# Patient Record
Sex: Male | Born: 1995
Health system: Southern US, Community
[De-identification: ages and names within clinical notes are randomized; demographics above are authoritative.]

## PROBLEM LIST (undated history)

## (undated) DIAGNOSIS — J45909 Unspecified asthma, uncomplicated: Secondary | ICD-10-CM

## (undated) HISTORY — PX: TYMPANOSTOMY TUBE PLACEMENT: SHX32

---
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
## 2016-06-28 ENCOUNTER — Encounter (HOSPITAL_COMMUNITY): Payer: Self-pay | Admitting: *Deleted

## 2016-06-28 ENCOUNTER — Emergency Department (HOSPITAL_COMMUNITY)
Admission: EM | Admit: 2016-06-28 | Discharge: 2016-06-28 | Disposition: A | Discharge status: Home / Self Care | Payer: Commercial Managed Care - PPO | Attending: Emergency Medicine | Admitting: Emergency Medicine

## 2016-06-28 DIAGNOSIS — E1165 Type 2 diabetes mellitus with hyperglycemia: Secondary | ICD-10-CM | POA: Diagnosis not present

## 2016-06-28 DIAGNOSIS — Z79899 Other long term (current) drug therapy: Secondary | ICD-10-CM | POA: Diagnosis not present

## 2016-06-28 DIAGNOSIS — K529 Noninfective gastroenteritis and colitis, unspecified: Secondary | ICD-10-CM | POA: Diagnosis not present

## 2016-06-28 DIAGNOSIS — E860 Dehydration: Secondary | ICD-10-CM | POA: Insufficient documentation

## 2016-06-28 DIAGNOSIS — J45909 Unspecified asthma, uncomplicated: Secondary | ICD-10-CM | POA: Insufficient documentation

## 2016-06-28 DIAGNOSIS — R739 Hyperglycemia, unspecified: Secondary | ICD-10-CM

## 2016-06-28 DIAGNOSIS — Z794 Long term (current) use of insulin: Secondary | ICD-10-CM | POA: Insufficient documentation

## 2016-06-28 LAB — BASIC METABOLIC PANEL
Anion gap: 13 (ref 5–15)
BUN: 23 mg/dL — AB (ref 6–20)
CALCIUM: 9.3 mg/dL (ref 8.9–10.3)
CO2: 21 mmol/L — AB (ref 22–32)
CREATININE: 1.18 mg/dL (ref 0.61–1.24)
Chloride: 94 mmol/L — ABNORMAL LOW (ref 101–111)
GFR calc Af Amer: >60 mL/min (ref >60)
GFR calc non Af Amer: >60 mL/min (ref >60)
GLUCOSE: 410 mg/dL — AB (ref 65–99)
Potassium: 4 mmol/L (ref 3.5–5.1)
Sodium: 128 mmol/L — ABNORMAL LOW (ref 135–145)

## 2016-06-28 LAB — CBC
HCT: 47.2 % (ref 39.0–52.0)
Hemoglobin: 16.6 g/dL (ref 13.0–17.0)
MCH: 29.2 pg (ref 26.0–34.0)
MCHC: 35.2 g/dL (ref 30.0–36.0)
MCV: 83.1 fL (ref 78.0–100.0)
PLATELETS: 312 10*3/uL (ref 150–400)
RBC: 5.68 MIL/uL (ref 4.22–5.81)
RDW: 11.8 % (ref 11.5–15.5)
WBC: 13 10*3/uL — ABNORMAL HIGH (ref 4.0–10.5)

## 2016-06-28 LAB — URINALYSIS, ROUTINE W REFLEX MICROSCOPIC
Bacteria, UA: NONE SEEN
Bilirubin Urine: NEGATIVE
HGB URINE DIPSTICK: NEGATIVE
Ketones, ur: 20 mg/dL — AB
Leukocytes, UA: NEGATIVE
Nitrite: NEGATIVE
Protein, ur: NEGATIVE mg/dL
SPECIFIC GRAVITY, URINE: 1.028 (ref 1.005–1.030)
Squamous Epithelial / LPF: NONE SEEN
pH: 5 (ref 5.0–8.0)

## 2016-06-28 LAB — CBG MONITORING, ED
Glucose-Capillary: 377 mg/dL — ABNORMAL HIGH (ref 65–99)
Glucose-Capillary: 307 mg/dL — ABNORMAL HIGH (ref 65–99)

## 2016-06-28 MED ORDER — INSULIN ASPART 100 UNIT/ML ~~LOC~~ SOLN
10.0000 [IU] | Freq: Once | SUBCUTANEOUS | Status: AC
  Administered 2016-06-28: 10 [IU] via INTRAVENOUS
  Filled 2016-06-28: qty 1

## 2016-06-28 MED ORDER — SODIUM CHLORIDE 0.9 % IV BOLUS (SEPSIS)
2000.0000 mL | Freq: Once | INTRAVENOUS | Status: AC
  Administered 2016-06-28: 2000 mL via INTRAVENOUS

## 2016-06-28 MED ORDER — ONDANSETRON 4 MG PO TBDP: 4.0000 mg | ORAL_TABLET | Freq: Three times a day (TID) | ORAL | 0 refills | Status: AC | PRN

## 2016-06-28 MED ORDER — ONDANSETRON HCL 4 MG/2ML IJ SOLN
4.0000 mg | Freq: Once | INTRAMUSCULAR | Status: AC
  Administered 2016-06-28: 4 mg via INTRAVENOUS
  Filled 2016-06-28: qty 2

## 2016-06-28 NOTE — ED Provider Notes (Signed)
[facility]-EMERGENCY DEPT Provider Note   CSN: [id number] Arrival date & time: 06/28/16  1645     History   Chief Complaint Chief Complaint  Patient presents with  . Hyperglycemia    HPI Ronald Harrison is a 21 y.o. male.  Patient is a 21 year old male with a history of insulin-dependent diabetes presenting today from clinic for nausea, vomiting, diarrhea and elevated ketones in his urine. Patient states yesterday he woke up with son onset of nausea vomiting and diarrhea. He vomited over 20 times. Today he is felt weak and dizzy mostly with standing and walking.  He has not vomited today but has not had anything to eat or drink. He is only used 8 units of insulin. He denies any abdominal pain, dysuria, frequency or urgency. He denies any cough, shortness of breath, or sore throat.   The history is provided by the patient.    Past Medical History:  Diagnosis Date  . Asthma   . Diabetes mellitus     There are no active problems to display for this patient.   Past Surgical History:  Procedure Laterality Date  . TYMPANOSTOMY TUBE PLACEMENT         Home Medications    Prior to Admission medications   Medication Sig Start Date End Date Taking? Authorizing Provider  BD PEN NEEDLE NANO U/F 32G X 4 MM MISC USE AS DIRECTED TO DOSE INSULIN UP TO 8 TIMES PER DAY 08/03/15   Historical Provider, MD  insulin aspart (NOVOLOG) 100 UNIT/ML FlexPen Inject into the skin 3 (three) times daily with meals.    Historical Provider, MD  Insulin Degludec (TRESIBA FLEXTOUCH) 100 UNIT/ML SOPN Inject 50 Units into the skin daily. 10/02/15   Reather Littler, MD  insulin glargine (LANTUS) 100 UNIT/ML injection Inject into the skin at bedtime.    Historical Provider, MD  Insulin Glargine (TOUJEO SOLOSTAR) 300 UNIT/ML SOPN Inject 50 Units into the skin daily. 10/05/15   Reather Littler, MD  lisinopril (PRINIVIL,ZESTRIL) 30 MG tablet TAKE 1 TABLET ONCE A DAY ORALLY 30 DAY(S) 09/25/15   Historical Provider, MD  pravastatin  (PRAVACHOL) 10 MG tablet TAKE 1 TABLET ONCE A DAY ORALLY *INS ALLOWS 30 DAYS 09/25/15   Historical Provider, MD    Family History Family History  Problem Relation Age of Onset  . Diabetes Father   . Diabetes Maternal Uncle   . Hypothyroidism Maternal Grandmother     Social History Social History  Substance Use Topics  . Smoking status: Never Smoker  . Smokeless tobacco: Never Used  . Alcohol use No     Allergies   Patient has no known allergies.   Review of Systems Review of Systems  All other systems reviewed and are negative.    Physical Exam Updated Vital Signs BP 120/76 (BP Location: Left Arm)   Pulse 119   Temp 98.9 F (37.2 C) (Oral)   Resp 17   Ht 5\' 7"  (1.702 m)   Wt 217 lb (98.4 kg)   SpO2 99%   BMI 33.99 kg/m   Physical Exam  Constitutional: He is oriented to person, place, and time. He appears well-developed and well-nourished. No distress.  HENT:  Head: Normocephalic and atraumatic.  Mouth/Throat: Oropharynx is clear and moist. Mucous membranes are dry.  Eyes: Conjunctivae and EOM are normal. Pupils are equal, round, and reactive to light.  Neck: Normal range of motion. Neck supple.  Cardiovascular: Regular rhythm and intact distal pulses.  Tachycardia present.   No murmur  heard. Pulmonary/Chest: Effort normal and breath sounds normal. No respiratory distress. He has no wheezes. He has no rales.  Abdominal: Soft. He exhibits no distension. There is no tenderness. There is no rebound and no guarding.  Musculoskeletal: Normal range of motion. He exhibits no edema or tenderness.  Neurological: He is alert and oriented to person, place, and time.  Skin: Skin is warm and dry. No rash noted. No erythema.  Psychiatric: He has a normal mood and affect. His behavior is normal.  Nursing note and vitals reviewed.    ED Treatments / Results  Labs (all labs ordered are listed, but only abnormal results are displayed) Labs Reviewed  BASIC METABOLIC PANEL  - Abnormal; Notable for the following:       Result Value   Sodium 128 (*)    Chloride 94 (*)    CO2 21 (*)    Glucose, Bld 410 (*)    BUN 23 (*)    All other components within normal limits  CBC - Abnormal; Notable for the following:    WBC 13.0 (*)    All other components within normal limits  URINALYSIS, ROUTINE W REFLEX MICROSCOPIC - Abnormal; Notable for the following:    Glucose, UA >=500 (*)    Ketones, ur 20 (*)    All other components within normal limits  CBG MONITORING, ED - Abnormal; Notable for the following:    Glucose-Capillary 377 (*)    All other components within normal limits  CBG MONITORING, ED - Abnormal; Notable for the following:    Glucose-Capillary 307 (*)    All other components within normal limits    EKG  EKG Interpretation None       Radiology No results found.  Procedures Procedures (including critical care time)  Medications Ordered in ED Medications  sodium chloride 0.9 % bolus 2,000 mL (not administered)  ondansetron (ZOFRAN) injection 4 mg (not administered)     Initial Impression / Assessment and Plan / ED Course  I have reviewed the triage vital signs and the nursing notes.  Pertinent labs & imaging results that were available during my care of the patient were reviewed by me and considered in my medical decision making (see chart for details).    Pt with symptoms most consistent with a viral process with fever/vomitting/diarrhea starting yesterday and improved today but feeling weak and dizzy with standing and walking.  Pt has not used much insulin today because he has not eaten.  He was seen at the clinic today and stated his ketones were high and sent here.  Denies bad food exposure and recent travel out of the country.  No recent abx.  No hx concerning for GU pathology or kidney stones.  Pt is awake and alert on exam without peritoneal signs. Labs without signs of DKA today.  Anion gap wnl.  Hyperglcyemia and  pseudohyponatremia, mild leukocytosis but o/w wnl. Pt given IVF and zofran  9:16 PM UA with only 20 ketones.  Pt on re-eval is hungry and eating.  Repeat BS 307.  Pt to start sliding scale at home.  Will d/c home.  Given 10u of insulin prior to discharge.   Final Clinical Impressions(s) / ED Diagnoses   Final diagnoses:  Hyperglycemia  Dehydration  Gastroenteritis, acute    New Prescriptions New Prescriptions   ONDANSETRON (ZOFRAN ODT) 4 MG DISINTEGRATING TABLET    Take 1 tablet (4 mg total) by mouth every 8 (eight) hours as needed for nausea or vomiting.  Gwyneth Sprout, MD 06/28/16 2122

## 2016-06-28 NOTE — ED Triage Notes (Signed)
The pt is a  Insulin dependent  Diabetic that was sent here from his doctors office with abd pain nv and diarrhea since yesterday  He was checked for ketones and that registered high.  I cannot smell ketones on his breath

## 2016-12-09 DIAGNOSIS — E109 Type 1 diabetes mellitus without complications: Secondary | ICD-10-CM | POA: Diagnosis not present

## 2016-12-09 DIAGNOSIS — E1065 Type 1 diabetes mellitus with hyperglycemia: Secondary | ICD-10-CM | POA: Diagnosis not present

## 2016-12-09 DIAGNOSIS — Z974 Presence of external hearing-aid: Secondary | ICD-10-CM | POA: Diagnosis not present

## 2016-12-16 DIAGNOSIS — E785 Hyperlipidemia, unspecified: Secondary | ICD-10-CM | POA: Diagnosis not present

## 2016-12-16 DIAGNOSIS — E109 Type 1 diabetes mellitus without complications: Secondary | ICD-10-CM | POA: Diagnosis not present

## 2016-12-16 DIAGNOSIS — I10 Essential (primary) hypertension: Secondary | ICD-10-CM | POA: Diagnosis not present

## 2017-01-16 IMAGING — CR DG CHEST 2V
2 series · 2 of 2 positions shown · non-contrast
Comparison: None.

CLINICAL DATA: Left-sided chest pain, intracostal pain, history of
asthma, diabetes

EXAM:
CHEST  2 VIEW

[w chest pa]
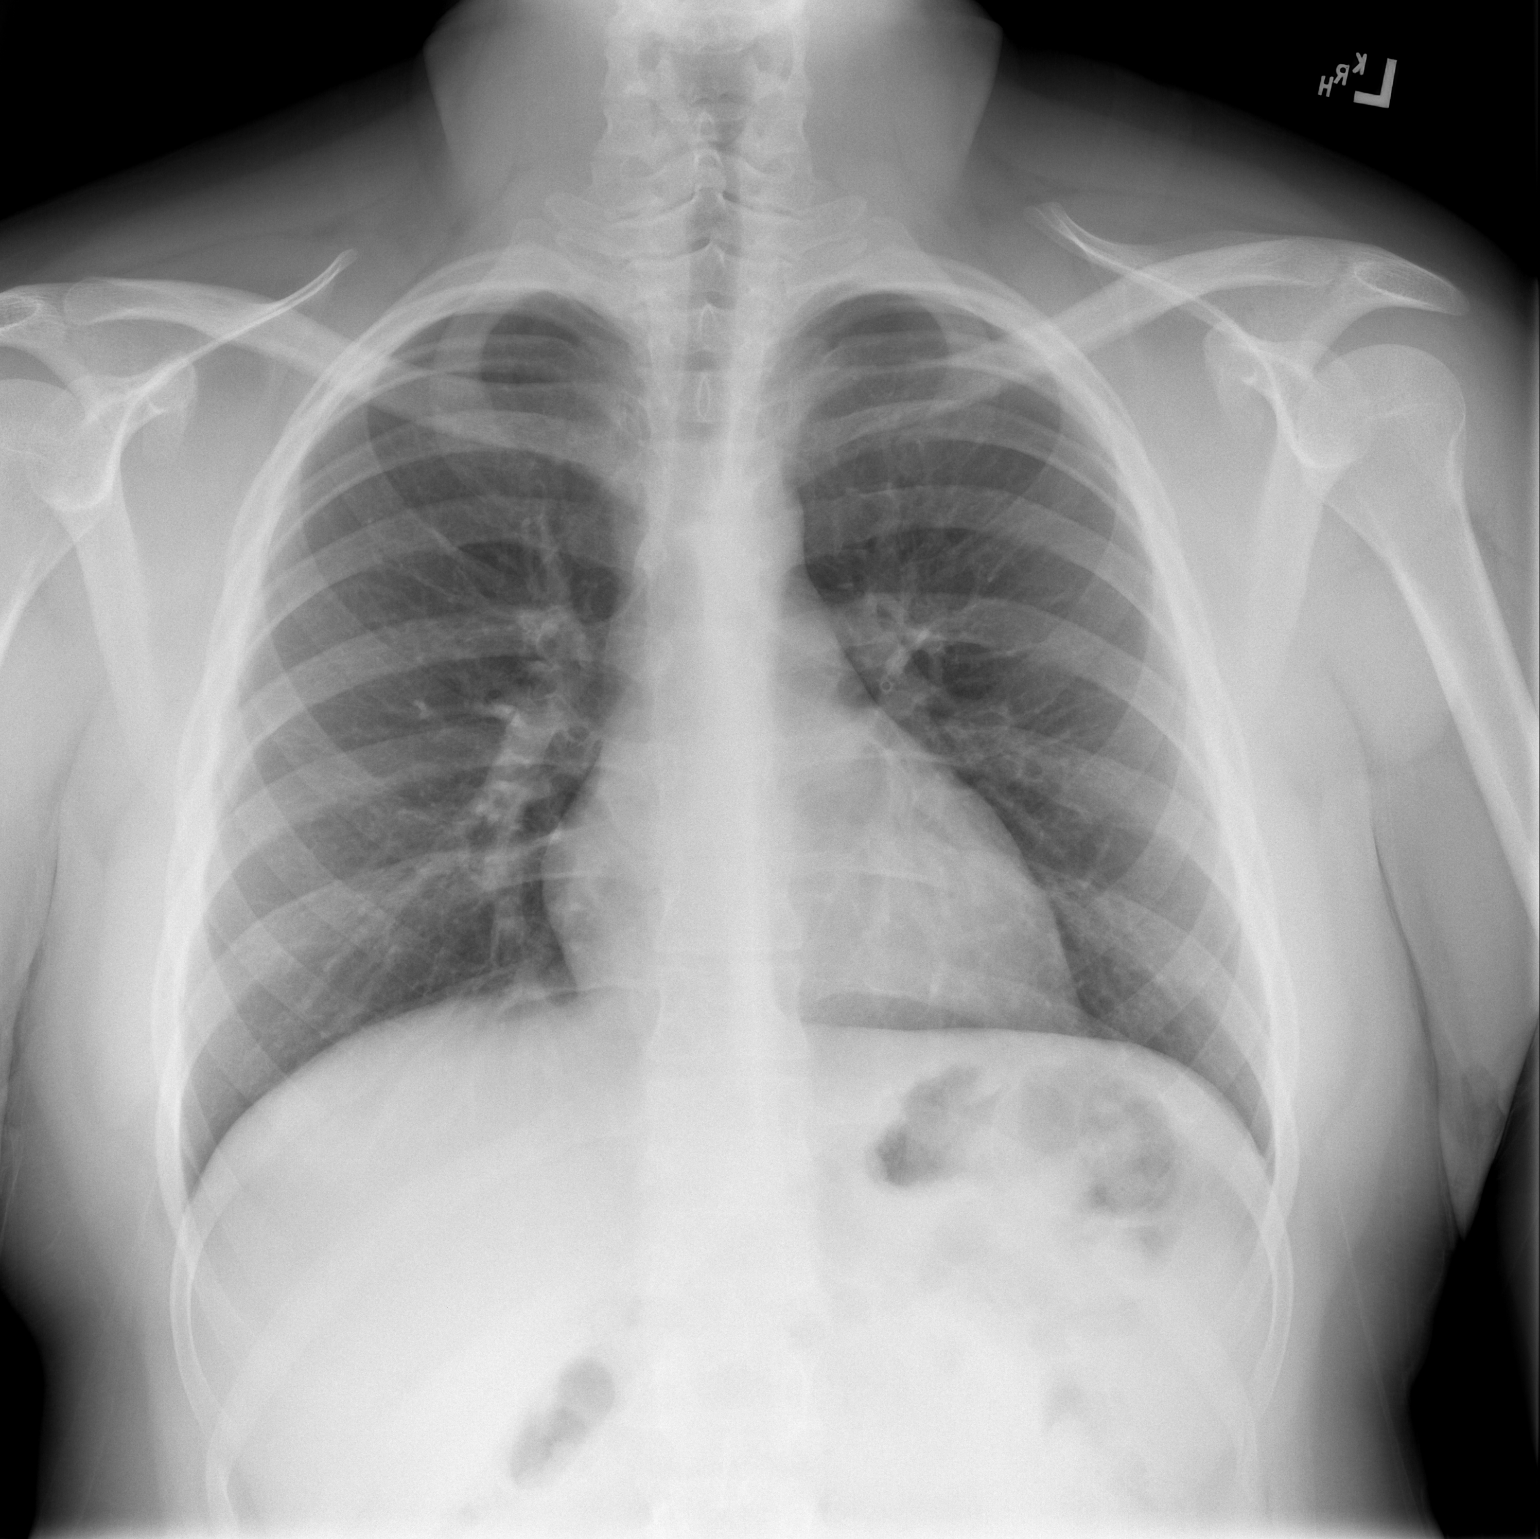

[w chest lat]
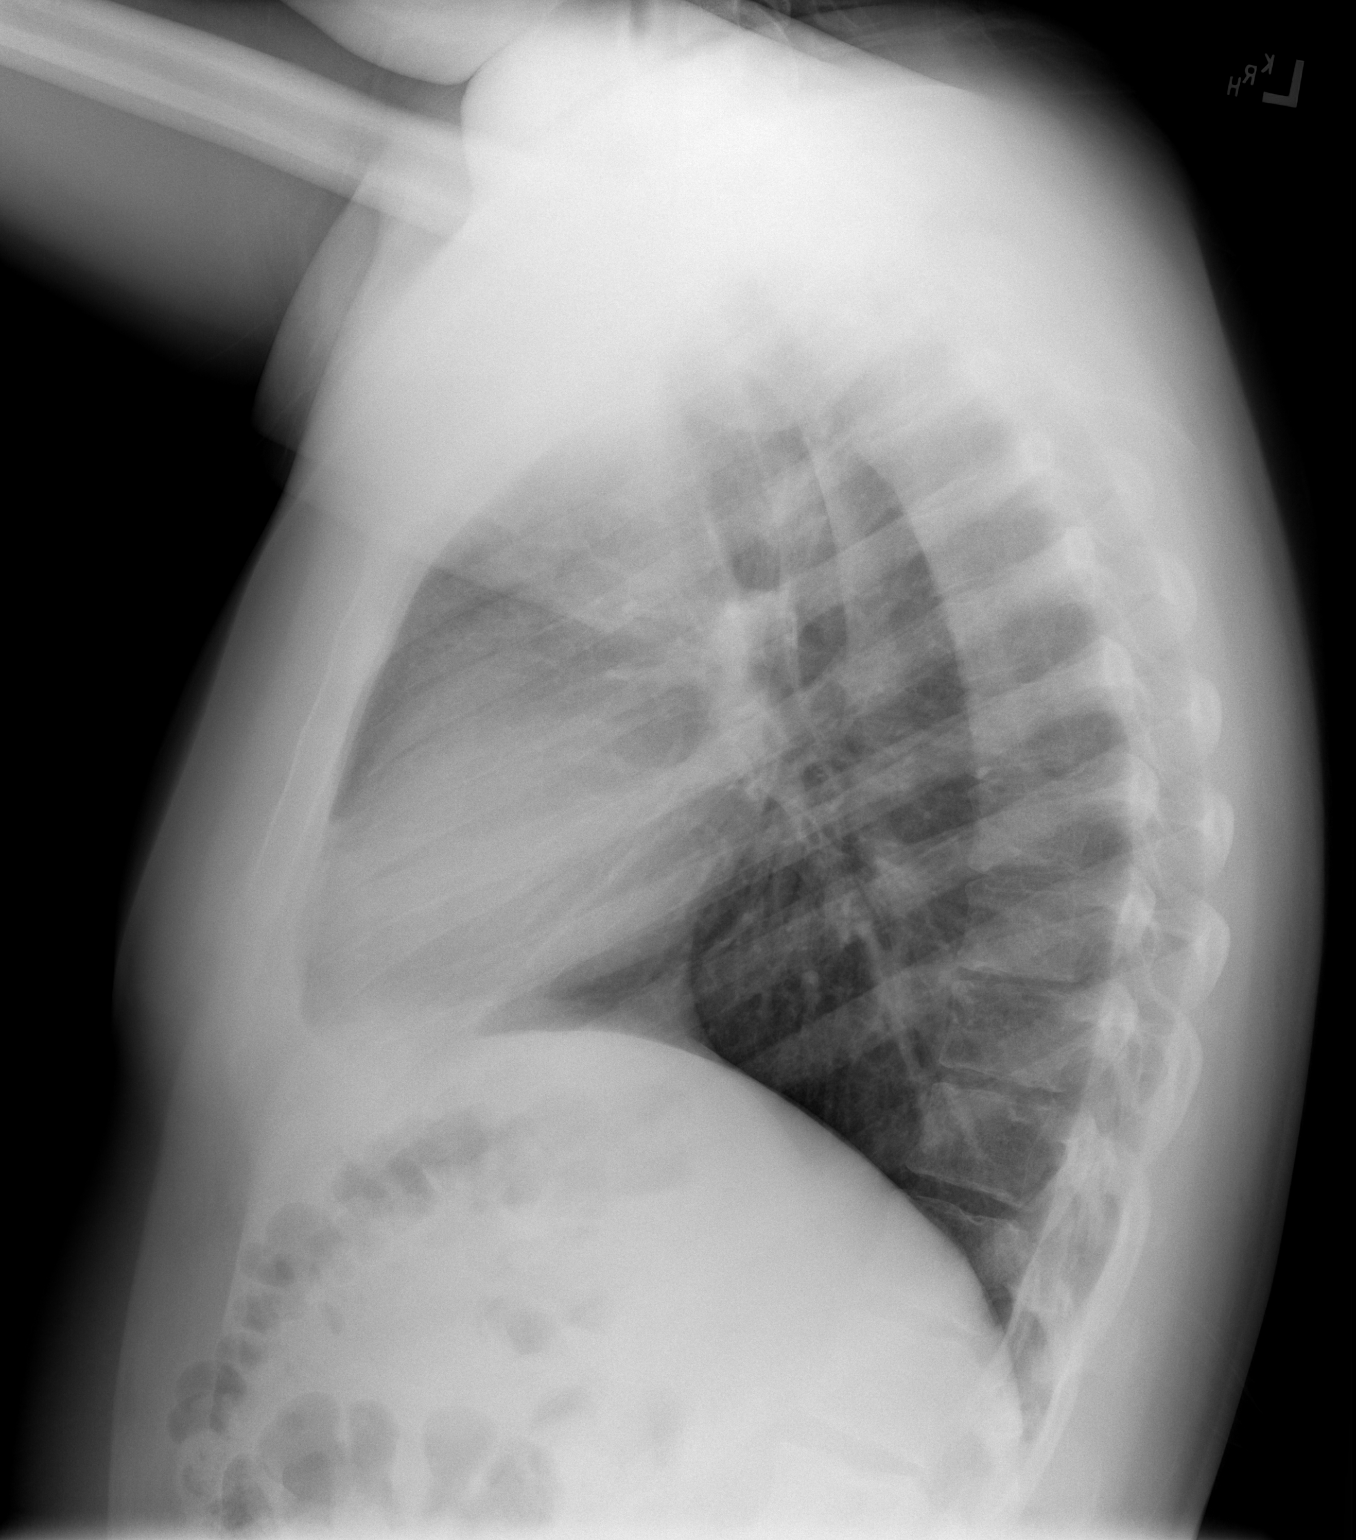

[2 of 2 positions shown; findings below may reference images not displayed]

FINDINGS: No active infiltrate or effusion is seen. Mediastinal and hilar
contours are unremarkable. Mild peribronchial thickening is noted.
The heart is within normal limits in size. No bony abnormality is
seen.
IMPRESSION: No active lung disease. Mild peribronchial thickening may indicate
bronchitis.

## 2017-01-27 DIAGNOSIS — E875 Hyperkalemia: Secondary | ICD-10-CM | POA: Diagnosis not present

## 2017-07-03 DIAGNOSIS — J069 Acute upper respiratory infection, unspecified: Secondary | ICD-10-CM | POA: Diagnosis not present

## 2017-07-03 DIAGNOSIS — H6692 Otitis media, unspecified, left ear: Secondary | ICD-10-CM | POA: Diagnosis not present

## 2017-07-06 DIAGNOSIS — J309 Allergic rhinitis, unspecified: Secondary | ICD-10-CM | POA: Diagnosis not present

## 2017-07-06 DIAGNOSIS — J069 Acute upper respiratory infection, unspecified: Secondary | ICD-10-CM | POA: Diagnosis not present

## 2017-07-24 DIAGNOSIS — R509 Fever, unspecified: Secondary | ICD-10-CM | POA: Diagnosis not present

## 2017-07-24 DIAGNOSIS — R05 Cough: Secondary | ICD-10-CM | POA: Diagnosis not present

## 2017-10-08 ENCOUNTER — Telehealth: Payer: Self-pay

## 2017-10-08 NOTE — Telephone Encounter (Signed)
[place] would like to get medical clearance from you for pt to have wisdom teeth removed with IV sedation on 11/19/17. Please advise.

## 2017-10-13 NOTE — Telephone Encounter (Signed)
Pt called and detailed voicemail left for patient explaining that MD will not provide medical clearance until he is seen in the office. Pt was asked to call back and schedule an appointment.

## 2017-10-13 NOTE — Telephone Encounter (Signed)
He will need to be seen as a 30-minute visit for evaluation, no visits since 2017

## 2017-11-19 DIAGNOSIS — K089 Disorder of teeth and supporting structures, unspecified: Secondary | ICD-10-CM | POA: Diagnosis not present

## 2017-12-12 DIAGNOSIS — I10 Essential (primary) hypertension: Secondary | ICD-10-CM | POA: Diagnosis not present

## 2017-12-12 DIAGNOSIS — E785 Hyperlipidemia, unspecified: Secondary | ICD-10-CM | POA: Diagnosis not present

## 2017-12-12 DIAGNOSIS — E1069 Type 1 diabetes mellitus with other specified complication: Secondary | ICD-10-CM | POA: Diagnosis not present

## 2018-02-14 DIAGNOSIS — Z23 Encounter for immunization: Secondary | ICD-10-CM | POA: Diagnosis not present

## 2018-03-06 DIAGNOSIS — E785 Hyperlipidemia, unspecified: Secondary | ICD-10-CM | POA: Diagnosis not present

## 2018-03-06 DIAGNOSIS — E669 Obesity, unspecified: Secondary | ICD-10-CM | POA: Diagnosis not present

## 2018-03-06 DIAGNOSIS — Z23 Encounter for immunization: Secondary | ICD-10-CM | POA: Diagnosis not present

## 2018-03-06 DIAGNOSIS — I10 Essential (primary) hypertension: Secondary | ICD-10-CM | POA: Diagnosis not present

## 2018-03-06 DIAGNOSIS — E109 Type 1 diabetes mellitus without complications: Secondary | ICD-10-CM | POA: Diagnosis not present

## 2018-04-11 ENCOUNTER — Emergency Department (HOSPITAL_BASED_OUTPATIENT_CLINIC_OR_DEPARTMENT_OTHER): Payer: Worker's Compensation

## 2018-04-11 ENCOUNTER — Other Ambulatory Visit: Payer: Self-pay

## 2018-04-11 ENCOUNTER — Emergency Department (HOSPITAL_BASED_OUTPATIENT_CLINIC_OR_DEPARTMENT_OTHER)
Admission: EM | Admit: 2018-04-11 | Discharge: 2018-04-11 | Disposition: A | Discharge status: Home / Self Care | Payer: Worker's Compensation | Attending: Emergency Medicine | Admitting: Emergency Medicine

## 2018-04-11 ENCOUNTER — Encounter (HOSPITAL_BASED_OUTPATIENT_CLINIC_OR_DEPARTMENT_OTHER): Payer: Self-pay | Admitting: *Deleted

## 2018-04-11 DIAGNOSIS — Z23 Encounter for immunization: Secondary | ICD-10-CM | POA: Diagnosis not present

## 2018-04-11 DIAGNOSIS — Z794 Long term (current) use of insulin: Secondary | ICD-10-CM | POA: Diagnosis not present

## 2018-04-11 DIAGNOSIS — W260XXA Contact with knife, initial encounter: Secondary | ICD-10-CM | POA: Insufficient documentation

## 2018-04-11 DIAGNOSIS — T1490XA Injury, unspecified, initial encounter: Secondary | ICD-10-CM

## 2018-04-11 DIAGNOSIS — Y929 Unspecified place or not applicable: Secondary | ICD-10-CM | POA: Diagnosis not present

## 2018-04-11 DIAGNOSIS — E119 Type 2 diabetes mellitus without complications: Secondary | ICD-10-CM | POA: Diagnosis not present

## 2018-04-11 DIAGNOSIS — Y93G9 Activity, other involving cooking and grilling: Secondary | ICD-10-CM | POA: Insufficient documentation

## 2018-04-11 DIAGNOSIS — S61012A Laceration without foreign body of left thumb without damage to nail, initial encounter: Secondary | ICD-10-CM | POA: Insufficient documentation

## 2018-04-11 DIAGNOSIS — Y990 Civilian activity done for income or pay: Secondary | ICD-10-CM | POA: Insufficient documentation

## 2018-04-11 DIAGNOSIS — S61011A Laceration without foreign body of right thumb without damage to nail, initial encounter: Secondary | ICD-10-CM

## 2018-04-11 LAB — CBG MONITORING, ED: GLUCOSE-CAPILLARY: 169 mg/dL — AB (ref 70–99)

## 2018-04-11 MED ORDER — TETANUS-DIPHTH-ACELL PERTUSSIS 5-2.5-18.5 LF-MCG/0.5 IM SUSP
0.5000 mL | Freq: Once | INTRAMUSCULAR | Status: AC
  Administered 2018-04-11: 0.5 mL via INTRAMUSCULAR
  Filled 2018-04-11: qty 0.5

## 2018-04-11 MED ORDER — LIDOCAINE HCL (PF) 1 % IJ SOLN
5.0000 mL | Freq: Once | INTRAMUSCULAR | Status: DC
  Filled 2018-04-11: qty 5

## 2018-04-11 NOTE — Discharge Instructions (Addendum)
Follow instructions regarding wound care. Return to ED for signs of infection including redness, pus draining from area, fever or increased swelling.

## 2018-04-11 NOTE — ED Notes (Signed)
Right thumb laceration irrigated with normal saline.  EDP at bedside.

## 2018-04-11 NOTE — ED Provider Notes (Signed)
[facility] EMERGENCY DEPARTMENT Provider Note   CSN: [id number] Arrival date & time: 04/11/18  1837     History   Chief Complaint Chief Complaint  Patient presents with  . Laceration    HPI Ronald Harrison is a 22 y.o. male who presents to ED for left thumb laceration that occurred 1 hour prior to arrival.  He was at work when he scraped his hand with a rib knife.  He is unsure of last tetanus.  He reports immediate bleeding in the area.  Denies any other complaints.  HPI  Past Medical History:  Diagnosis Date  . Asthma   . Diabetes mellitus     There are no active problems to display for this patient.   Past Surgical History:  Procedure Laterality Date  . TYMPANOSTOMY TUBE PLACEMENT          Home Medications    Prior to Admission medications   Medication Sig Start Date End Date Taking? Authorizing Provider  BD PEN NEEDLE NANO U/F 32G X 4 MM MISC USE AS DIRECTED TO DOSE INSULIN UP TO 8 TIMES PER DAY 08/03/15   [provider]  diphenhydrAMINE (BENADRYL) 25 mg capsule Take 25 mg by mouth every 6 (six) hours as needed for allergies.    [provider]  insulin aspart (NOVOLOG) 100 UNIT/ML FlexPen Inject into the skin 3 (three) times daily with meals. inject 1 units per 4 carbs    [provider]  Insulin Degludec (TRESIBA FLEXTOUCH) 100 UNIT/ML SOPN Inject 50 Units into the skin daily. Patient not taking: Reported on 06/28/2016 10/02/15   Reather LittlerKumar, Ajay, MD  insulin glargine (LANTUS) 100 UNIT/ML injection Inject 55 Units into the skin at bedtime.     [provider]  Insulin Glargine (TOUJEO SOLOSTAR) 300 UNIT/ML SOPN Inject 50 Units into the skin daily. Patient not taking: Reported on 06/28/2016 10/05/15   Reather LittlerKumar, Ajay, MD  lisinopril (PRINIVIL,ZESTRIL) 30 MG tablet TAKE 1 TABLET ONCE A DAY ORALLY 30 DAY(S) 09/25/15   [provider]  ondansetron (ZOFRAN ODT) 4 MG disintegrating tablet Take 1 tablet (4 mg total) by mouth  every 8 (eight) hours as needed for nausea or vomiting. 06/28/16   Gwyneth SproutPlunkett, Whitney, MD  pravastatin (PRAVACHOL) 10 MG tablet TAKE 1 TABLET ONCE A DAY ORALLY *INS ALLOWS 30 DAYS 09/25/15   [provider]    Family History Family History  Problem Relation Age of Onset  . Diabetes Father   . Diabetes Maternal Uncle   . Hypothyroidism Maternal Grandmother     Social History Social History   Tobacco Use  . Smoking status: Never Smoker  . Smokeless tobacco: Never Used  Substance Use Topics  . Alcohol use: Yes  . Drug use: No     Allergies   Patient has no known allergies.   Review of Systems Review of Systems  Constitutional: Negative for chills and fever.  Gastrointestinal: Negative for vomiting.  Musculoskeletal: Negative for arthralgias.  Skin: Positive for wound.     Physical Exam Updated Vital Signs BP (!) 141/104 (BP Location: Right Arm)   Pulse 95   Temp 98.8 F (37.1 C) (Oral)   Resp 18   Ht 5\' 8"  (1.727 m)   SpO2 96%   BMI 32.99 kg/m   Physical Exam Vitals signs and nursing note reviewed.  Constitutional:      General: He is not in acute distress.    Appearance: He is well-developed. He is not diaphoretic.  HENT:     Head: Normocephalic and atraumatic.  Eyes:     General: No scleral icterus.    Conjunctiva/sclera: Conjunctivae normal.  Neck:     Musculoskeletal: Normal range of motion.  Pulmonary:     Effort: Pulmonary effort is normal. No respiratory distress.  Skin:    Findings: No rash.  Neurological:     Mental Status: He is alert.     Comments: 1 cm superficial, nongaping laceration noted over the IP joint of the left thumb.  2+ radial pulse palpated. FROM of digits. Sensation intact to light touch of digits.      ED Treatments / Results  Labs (all labs ordered are listed, but only abnormal results are displayed) Labs Reviewed  CBG MONITORING, ED - Abnormal; Notable for the following components:      Result Value    Glucose-Capillary 169 (*)    All other components within normal limits    EKG None  Radiology Dg Finger Thumb Left  Result Date: 04/11/2018 CLINICAL DATA:  Cut thumb laceration EXAM: LEFT THUMB 2+V COMPARISON:  None. FINDINGS: No fracture or malalignment. No radiopaque foreign body in the soft tissues IMPRESSION: No acute osseous abnormality Electronically Signed   By: Jasmine PangKim  Fujinaga M.D.   On: 04/11/2018 20:03    Procedures .Marland Kitchen.Laceration Repair Date/Time: 04/11/2018 8:04 PM Performed by: Dietrich PatesKhatri, Darrin Koman, PA-C Authorized by: Dietrich PatesKhatri, Tamaria Dunleavy, PA-C   Consent:    Consent obtained:  Verbal   Consent given by:  Patient   Risks discussed:  Infection, need for additional repair, nerve damage, pain, poor cosmetic result, poor wound healing, retained foreign body, tendon damage and vascular damage   Alternatives discussed:  No treatment Laceration details:    Location:  Finger   Finger location:  L thumb   Length (cm):  1 Exploration:    Hemostasis achieved with:  Direct pressure Treatment:    Area cleansed with:  Saline   Amount of cleaning:  Extensive   Irrigation solution:  Sterile saline   Irrigation method:  Pressure wash Skin repair:    Repair method:  Tissue adhesive Approximation:    Approximation:  Close Post-procedure details:    Dressing:  Open (no dressing)   Patient tolerance of procedure:  Tolerated well, no immediate complications   (including critical care time)  Medications Ordered in ED Medications  lidocaine (PF) (XYLOCAINE) 1 % injection 5 mL (has no administration in time range)  Tdap (BOOSTRIX) injection 0.5 mL (0.5 mLs Intramuscular Given 04/11/18 1945)     Initial Impression / Assessment and Plan / ED Course  I have reviewed the triage vital signs and the nursing notes.  Pertinent labs & imaging results that were available during my care of the patient were reviewed by me and considered in my medical decision making (see chart for details).      Patient counseled on wound care.  Area irrigated extensively with saline.  Patient wound glued as he is very hesitant for sutures. Patient was urged to return to the Emergency Department urgently with worsening pain, swelling, expanding erythema especially if it streaks away from the affected area, fever, or if they have any other concerns. Patient verbalized understanding.   Patient is hemodynamically stable, in NAD, and able to ambulate in the ED. Evaluation does not show pathology that would require ongoing emergent intervention or inpatient treatment. I explained the diagnosis to the patient. Pain has been managed and has no complaints prior to discharge. Patient is comfortable with above  plan and is stable for discharge at this time. All questions were answered prior to disposition. Strict return precautions for returning to the ED were discussed. Encouraged follow up with PCP.    Portions of this note were generated with Scientist, clinical (histocompatibility and immunogenetics). Dictation errors may occur despite best attempts at proofreading.  Final Clinical Impressions(s) / ED Diagnoses   Final diagnoses:  Laceration of right thumb without foreign body without damage to nail, initial encounter    ED Discharge Orders    None       Dietrich Pates, PA-C 04/11/18 2015    Jacalyn Lefevre, MD 04/11/18 2317

## 2018-04-11 NOTE — ED Notes (Signed)
ED Provider at bedside. 

## 2018-04-11 NOTE — ED Triage Notes (Signed)
Laceration to left thumb from knife while working

## 2019-02-26 ENCOUNTER — Other Ambulatory Visit: Payer: Self-pay | Admitting: Physician Assistant

## 2019-02-26 DIAGNOSIS — R2242 Localized swelling, mass and lump, left lower limb: Secondary | ICD-10-CM

## 2019-03-30 ENCOUNTER — Other Ambulatory Visit: Payer: Self-pay

## 2020-02-14 IMAGING — DX DG FINGER THUMB 2+V*L*
3 series · 3 of 3 positions shown · non-contrast
Comparison: None.

CLINICAL DATA: Cut thumb laceration

EXAM:
LEFT THUMB 2+V

[finger ap]
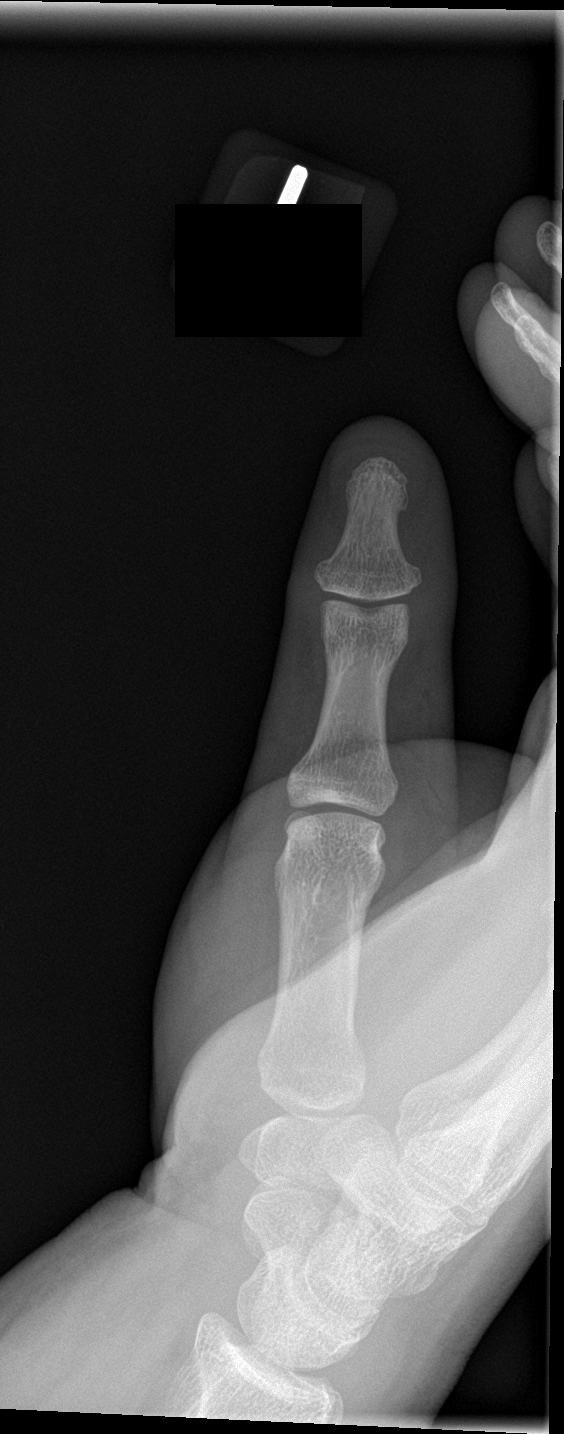

[finger obl]
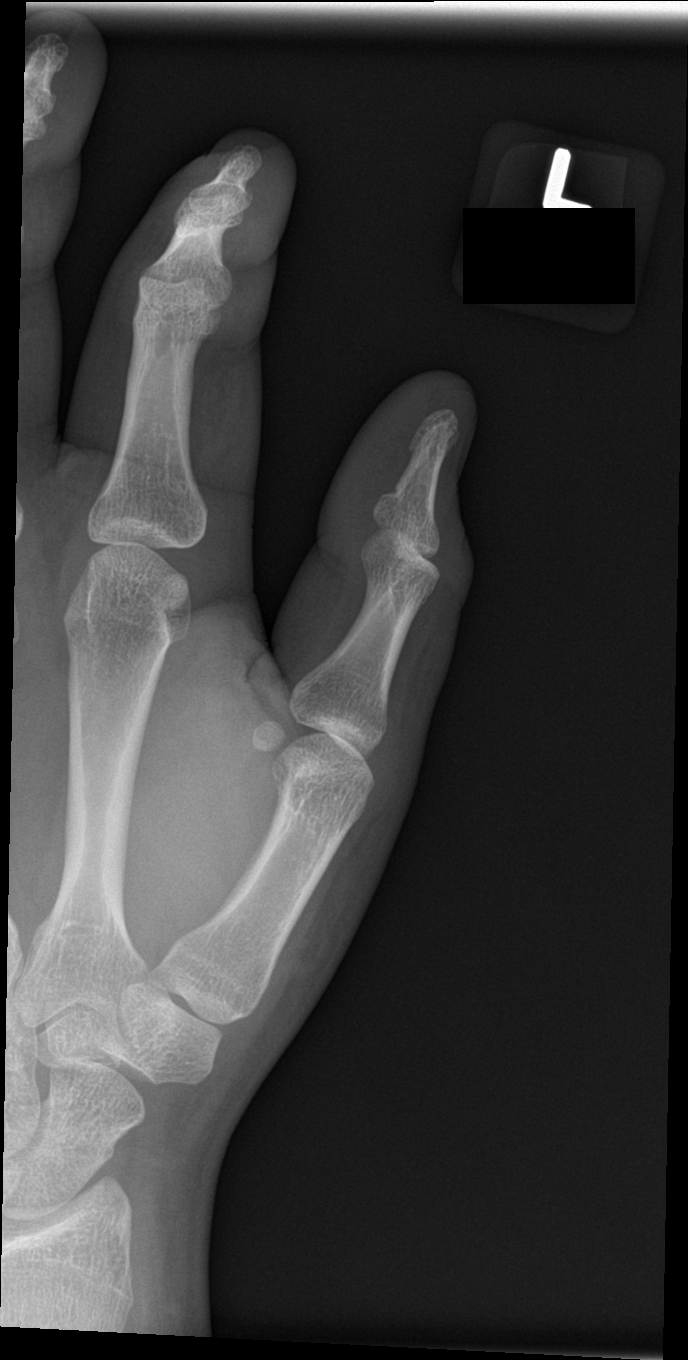

[finger lat]
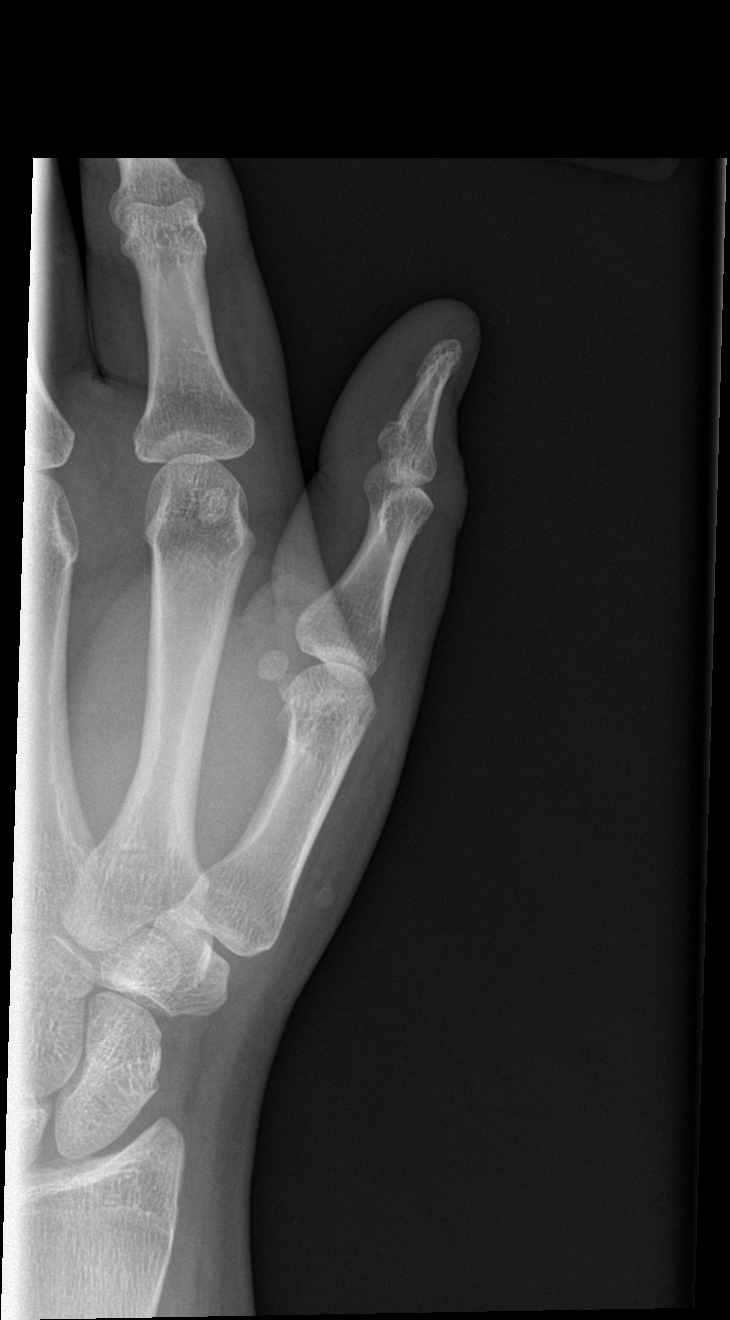

[3 of 3 positions shown; findings below may reference images not displayed]

FINDINGS: No fracture or malalignment. No radiopaque foreign body in the soft
tissues
IMPRESSION: No acute osseous abnormality

## 2021-02-26 ENCOUNTER — Encounter (HOSPITAL_BASED_OUTPATIENT_CLINIC_OR_DEPARTMENT_OTHER): Payer: Self-pay | Admitting: *Deleted

## 2021-11-07 ENCOUNTER — Ambulatory Visit: Payer: Self-pay | Admitting: Family Medicine

## 2023-08-06 DIAGNOSIS — E66812 Obesity, class 2: Secondary | ICD-10-CM | POA: Diagnosis not present

## 2023-08-06 DIAGNOSIS — E039 Hypothyroidism, unspecified: Secondary | ICD-10-CM | POA: Diagnosis not present

## 2023-08-06 DIAGNOSIS — E1069 Type 1 diabetes mellitus with other specified complication: Secondary | ICD-10-CM | POA: Diagnosis not present
